# Patient Record
Sex: Female | Born: 2000 | Race: White | Hispanic: No | Marital: Single | Smoking: Never smoker
Health system: Southern US, Community
[De-identification: ages and names within clinical notes are randomized; demographics above are authoritative.]

## PROBLEM LIST (undated history)

## (undated) DIAGNOSIS — E079 Disorder of thyroid, unspecified: Secondary | ICD-10-CM

## (undated) DIAGNOSIS — Z23 Encounter for immunization: Secondary | ICD-10-CM

## (undated) HISTORY — DX: Encounter for immunization: Z23

## (undated) HISTORY — PX: OTHER SURGICAL HISTORY: SHX169

## (undated) HISTORY — PX: NO PAST SURGERIES: SHX2092

---
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
## 2021-03-02 ENCOUNTER — Other Ambulatory Visit: Payer: Self-pay

## 2021-03-02 DIAGNOSIS — Z3045 Encounter for surveillance of transdermal patch hormonal contraceptive device: Secondary | ICD-10-CM

## 2021-03-02 MED ORDER — XULANE 150-35 MCG/24HR TD PTWK: MEDICATED_PATCH | TRANSDERMAL | 0 refills | Status: DC

## 2021-03-02 NOTE — Telephone Encounter (Signed)
Pt calling; has scheduled appt for 8/23rd; will need refill of bc patches to get to appt.  [phone]  Left detailed msg rx has been sent in.

## 2021-04-14 ENCOUNTER — Ambulatory Visit: Payer: BC Managed Care – PPO | Admitting: Obstetrics and Gynecology

## 2021-04-16 ENCOUNTER — Other Ambulatory Visit: Payer: Self-pay | Admitting: Obstetrics and Gynecology

## 2021-04-16 DIAGNOSIS — Z3045 Encounter for surveillance of transdermal patch hormonal contraceptive device: Secondary | ICD-10-CM

## 2021-04-17 NOTE — Telephone Encounter (Signed)
Patient is scheduled for 05/01/21 with [facility] for annual

## 2021-04-30 ENCOUNTER — Other Ambulatory Visit (HOSPITAL_COMMUNITY)
Admission: RE | Admit: 2021-04-30 | Discharge: 2021-04-30 | Disposition: A | Discharge status: Home / Self Care | Payer: BC Managed Care – PPO | Source: Ambulatory Visit | Attending: Obstetrics and Gynecology | Admitting: Obstetrics and Gynecology

## 2021-04-30 ENCOUNTER — Ambulatory Visit (INDEPENDENT_AMBULATORY_CARE_PROVIDER_SITE_OTHER): Payer: BC Managed Care – PPO | Admitting: Obstetrics and Gynecology

## 2021-04-30 ENCOUNTER — Encounter: Payer: Self-pay | Admitting: Obstetrics and Gynecology

## 2021-04-30 ENCOUNTER — Other Ambulatory Visit: Payer: Self-pay

## 2021-04-30 VITALS — BP 100/80 | Ht 64.0 in | Wt 131.0 lb

## 2021-04-30 DIAGNOSIS — Z01419 Encounter for gynecological examination (general) (routine) without abnormal findings: Secondary | ICD-10-CM

## 2021-04-30 DIAGNOSIS — Z113 Encounter for screening for infections with a predominantly sexual mode of transmission: Secondary | ICD-10-CM | POA: Insufficient documentation

## 2021-04-30 DIAGNOSIS — Z3045 Encounter for surveillance of transdermal patch hormonal contraceptive device: Secondary | ICD-10-CM

## 2021-04-30 MED ORDER — ZAFEMY 150-35 MCG/24HR TD PTWK: MEDICATED_PATCH | TRANSDERMAL | 3 refills | Status: DC

## 2021-04-30 NOTE — Patient Instructions (Signed)
I value your feedback and you entrusting us with your care. If you get a  patient survey, I would appreciate you taking the time to let us know about your experience today. Thank you! ? ? ?

## 2021-04-30 NOTE — Progress Notes (Signed)
PCP:  [name], MD   Chief Complaint  Patient presents with   Gynecologic Exam    No concerns     HPI:      [name] is a 20 y.o. G0P0000 who LMP was Patient's last menstrual period was 04/29/2021 (exact date)., presents today for her annual examination.  Her menses are regular every 28-30 days, lasting 5 days.  Dysmenorrhea mild, occurring first 1-2 days of flow. She does not have intermenstrual bleeding.  Sex activity: single partner, contraception - xulane (started 12/20), happy with it. Side effects with previous BC.  No hx of HTN, DVTs, migraines.   Last Pap: N/A Hx of STDs: none  There is a FH of breast cancer in her MGM. MGM is BRCA neg, pt's mom is MyRisk neg. There is no FH of ovarian cancer. The patient does not do self-breast exams.  Tobacco use: The patient denies current or previous tobacco use. Alcohol use: none No drug use.  Exercise: mod active  She does get adequate calcium and Vitamin D in her diet. Gardasil completed.  Past Medical History:  Diagnosis Date   Thyroid disease    Vaccine for human papilloma virus (HPV) types 6, 11, 16, and 18 administered     Past Surgical History:  Procedure Laterality Date   NO PAST SURGERIES      Family History  Problem Relation Age of Onset   Other Mother        cervical dysplasia   Lung cancer Maternal Aunt    Breast cancer Maternal Grandmother 42       BRCA neg   Skin cancer Maternal Grandmother    Lung cancer Maternal Great-grandmother    Ovarian cancer Neg Hx     Social History   Socioeconomic History   Marital status: Single    Spouse name: Not on file   Number of children: Not on file   Years of education: Not on file   Highest education level: Not on file  Occupational History   Not on file  Tobacco Use   Smoking status: Never   Smokeless tobacco: Never  Vaping Use   Vaping Use: Never used  Substance and Sexual Activity   Alcohol use: No   Drug use: No    Sexual activity: Yes    Birth control/protection: Patch  Other Topics Concern   Not on file  Social History Narrative   Not on file   Social Determinants of Health   Financial Resource Strain: Not on file  Food Insecurity: Not on file  Transportation Needs: Not on file  Physical Activity: Not on file  Stress: Not on file  Social Connections: Not on file  Intimate Partner Violence: Not on file    Outpatient Medications Prior to Visit  Medication Sig Dispense Refill   esomeprazole (NEXIUM) 20 MG capsule Take by mouth.     levothyroxine (SYNTHROID) 100 MCG tablet Take 100 mcg by mouth daily.     ZAFEMY 150-35 MCG/24HR transdermal patch APPLY 1 PATCH TOPICALLY ONCE A WEEK FOR 3 WEEKS, THEN 1 WEEK WITHOUT PATCH. 6 patch 0   ergocalciferol (VITAMIN D2) 1.25 MG (50000 UT) capsule Take by mouth.     escitalopram (LEXAPRO) 20 MG tablet Take 20 mg by mouth every morning.     ondansetron (ZOFRAN-ODT) 4 MG disintegrating tablet      propranolol (INDERAL) 20 MG tablet Take by mouth.     No facility-administered medications prior to visit.  ROS:  Review of Systems  Constitutional:  Negative for fatigue, fever and unexpected weight change.  Respiratory:  Negative for cough, shortness of breath and wheezing.   Cardiovascular:  Negative for chest pain, palpitations and leg swelling.  Gastrointestinal:  Negative for blood in stool, constipation, diarrhea, nausea and vomiting.  Endocrine: Negative for cold intolerance, heat intolerance and polyuria.  Genitourinary:  Positive for dyspareunia. Negative for dysuria, flank pain, frequency, genital sores, hematuria, menstrual problem, pelvic pain, urgency, vaginal bleeding, vaginal discharge and vaginal pain.  Musculoskeletal:  Negative for arthralgias, back pain, joint swelling and myalgias.  Skin:  Negative for rash.  Neurological:  Negative for dizziness, syncope, light-headedness, numbness and headaches.  Hematological:  Negative for  adenopathy.  Psychiatric/Behavioral:  Negative for agitation, confusion, dysphoric mood, sleep disturbance and suicidal ideas. The patient is not nervous/anxious.  BREAST: No symptoms   Objective: BP 100/80   Ht 5' 4"  (1.626 m)   Wt 131 lb (59.4 kg)   LMP 04/29/2021 (Exact Date)   BMI 22.49 kg/m    Physical Exam Constitutional:      Appearance: She is well-developed.  Genitourinary:     Vulva normal.     Right Labia: No rash, tenderness or lesions.    Left Labia: No tenderness, lesions or rash.    No vaginal discharge, erythema or tenderness.      Right Adnexa: not tender and no mass present.    Left Adnexa: not tender and no mass present.    No cervical friability or polyp.     Uterus is not enlarged or tender.  Breasts:    Right: No mass, nipple discharge, skin change or tenderness.     Left: No mass, nipple discharge, skin change or tenderness.  Neck:     Thyroid: No thyromegaly.  Cardiovascular:     Rate and Rhythm: Normal rate and regular rhythm.     Heart sounds: Normal heart sounds. No murmur heard. Pulmonary:     Effort: Pulmonary effort is normal.     Breath sounds: Normal breath sounds.  Abdominal:     Palpations: Abdomen is soft.     Tenderness: There is no abdominal tenderness. There is no guarding or rebound.  Musculoskeletal:        General: Normal range of motion.     Cervical back: Normal range of motion.  Lymphadenopathy:     Cervical: No cervical adenopathy.  Neurological:     General: No focal deficit present.     Mental Status: She is alert and oriented to person, place, and time.     Cranial Nerves: No cranial nerve deficit.  Skin:    General: Skin is warm and dry.  Psychiatric:        Mood and Affect: Mood normal.        Behavior: Behavior normal.        Thought Content: Thought content normal.        Judgment: Judgment normal.  Vitals reviewed.    Assessment/Plan: Encounter for annual routine gynecological examination  Screening for  STD (sexually transmitted disease) - Plan: Cervicovaginal ancillary only  Encounter for surveillance of transdermal patch hormonal contraceptive device - Plan: norelgestromin-ethinyl estradiol (ZAFEMY) 150-35 MCG/24HR transdermal patch; Rx RF   Meds ordered this encounter  Medications   norelgestromin-ethinyl estradiol (ZAFEMY) 150-35 MCG/24HR transdermal patch    Sig: APPLY 1 PATCH TOPICALLY ONCE A WEEK FOR 3 WEEKS, THEN 1 WEEK WITHOUT PATCH.    Dispense:  9 patch  Refill:  3    Order Specific Question:   Supervising Provider    Answer:   Gae Dry [[id number]]              GYN counsel adequate intake of calcium and vitamin D, diet and exercise     F/U  Return in about 1 year (around 04/30/2022).   Keigan Tafoya B. Ostin Mathey, PA-C 04/30/2021 2:32 PM

## 2021-05-04 LAB — CERVICOVAGINAL ANCILLARY ONLY
Chlamydia: NEGATIVE
Comment: NEGATIVE
Comment: NORMAL
Neisseria Gonorrhea: NEGATIVE

## 2022-03-22 ENCOUNTER — Encounter: Payer: Self-pay | Admitting: Obstetrics and Gynecology

## 2022-03-22 NOTE — Telephone Encounter (Signed)
Can you call and schedule with [facility]. Pain with intercourse

## 2022-04-14 NOTE — Progress Notes (Unsigned)
    [name], MD   No chief complaint on file.   HPI:      [name] is a 21 y.o. G0P0000 whose LMP was No LMP recorded., presents today for ***  Neg STD testing 9/22  There are no problems to display for this patient.   Past Surgical History:  Procedure Laterality Date   NO PAST SURGERIES      Family History  Problem Relation Age of Onset   Other Mother        cervical dysplasia   Lung cancer Maternal Aunt    Breast cancer Maternal Grandmother 15       BRCA neg   Skin cancer Maternal Grandmother    Lung cancer Maternal Great-grandmother    Ovarian cancer Neg Hx     Social History   Socioeconomic History   Marital status: Single    Spouse name: Not on file   Number of children: Not on file   Years of education: Not on file   Highest education level: Not on file  Occupational History   Not on file  Tobacco Use   Smoking status: Never   Smokeless tobacco: Never  Vaping Use   Vaping Use: Never used  Substance and Sexual Activity   Alcohol use: No   Drug use: No   Sexual activity: Yes    Birth control/protection: Patch  Other Topics Concern   Not on file  Social History Narrative   Not on file   Social Determinants of Health   Financial Resource Strain: Not on file  Food Insecurity: Not on file  Transportation Needs: Not on file  Physical Activity: Not on file  Stress: Not on file  Social Connections: Not on file  Intimate Partner Violence: Not on file    Outpatient Medications Prior to Visit  Medication Sig Dispense Refill   esomeprazole (NEXIUM) 20 MG capsule Take by mouth.     levothyroxine (SYNTHROID) 100 MCG tablet Take 100 mcg by mouth daily.     norelgestromin-ethinyl estradiol (ZAFEMY) 150-35 MCG/24HR transdermal patch APPLY 1 PATCH TOPICALLY ONCE A WEEK FOR 3 WEEKS, THEN 1 WEEK WITHOUT PATCH. 9 patch 3   No facility-administered medications prior to visit.      ROS:  Review of Systems BREAST: No  symptoms   OBJECTIVE:   Vitals:  There were no vitals taken for this visit.  Physical Exam  Results: No results found for this or any previous visit (from the past 24 hour(s)).   Assessment/Plan: No diagnosis found.    No orders of the defined types were placed in this encounter.     No follow-ups on file.  Maralyn Witherell B. Amsi Grimley, PA-C 04/14/2022 3:22 PM

## 2022-04-15 ENCOUNTER — Other Ambulatory Visit (HOSPITAL_COMMUNITY)
Admission: RE | Admit: 2022-04-15 | Discharge: 2022-04-15 | Disposition: A | Discharge status: Home / Self Care | Payer: Commercial Managed Care - PPO | Source: Ambulatory Visit | Attending: Obstetrics and Gynecology | Admitting: Obstetrics and Gynecology

## 2022-04-15 ENCOUNTER — Encounter: Payer: Self-pay | Admitting: Obstetrics and Gynecology

## 2022-04-15 ENCOUNTER — Ambulatory Visit (INDEPENDENT_AMBULATORY_CARE_PROVIDER_SITE_OTHER): Payer: Commercial Managed Care - PPO | Admitting: Obstetrics and Gynecology

## 2022-04-15 VITALS — BP 90/60 | Ht 64.0 in | Wt 123.0 lb

## 2022-04-15 DIAGNOSIS — L309 Dermatitis, unspecified: Secondary | ICD-10-CM | POA: Diagnosis not present

## 2022-04-15 DIAGNOSIS — N941 Unspecified dyspareunia: Secondary | ICD-10-CM

## 2022-04-15 DIAGNOSIS — N930 Postcoital and contact bleeding: Secondary | ICD-10-CM

## 2022-04-15 DIAGNOSIS — Z113 Encounter for screening for infections with a predominantly sexual mode of transmission: Secondary | ICD-10-CM | POA: Insufficient documentation

## 2022-04-15 MED ORDER — FLUCONAZOLE 150 MG PO TABS: 150.0000 mg | ORAL_TABLET | Freq: Once | ORAL | 0 refills | Status: AC

## 2022-04-15 MED ORDER — CLOTRIMAZOLE-BETAMETHASONE 1-0.05 % EX CREA: TOPICAL_CREAM | CUTANEOUS | 0 refills | Status: DC

## 2022-04-15 NOTE — Patient Instructions (Signed)
I value your feedback and you entrusting us with your care. If you get a [facility] patient survey, I would appreciate you taking the time to let us know about your experience today. Thank you! ? ? ?

## 2022-04-16 LAB — CERVICOVAGINAL ANCILLARY ONLY
Chlamydia: NEGATIVE
Comment: NEGATIVE
Comment: NORMAL
Neisseria Gonorrhea: NEGATIVE

## 2022-05-08 ENCOUNTER — Other Ambulatory Visit: Payer: Self-pay | Admitting: Obstetrics and Gynecology

## 2022-05-08 DIAGNOSIS — Z3045 Encounter for surveillance of transdermal patch hormonal contraceptive device: Secondary | ICD-10-CM

## 2022-05-16 ENCOUNTER — Other Ambulatory Visit: Payer: Self-pay | Admitting: Obstetrics and Gynecology

## 2022-05-16 DIAGNOSIS — Z3045 Encounter for surveillance of transdermal patch hormonal contraceptive device: Secondary | ICD-10-CM

## 2022-05-17 ENCOUNTER — Encounter: Payer: Self-pay | Admitting: Obstetrics and Gynecology

## 2023-04-14 ENCOUNTER — Other Ambulatory Visit: Payer: Self-pay | Admitting: Obstetrics and Gynecology

## 2023-04-14 DIAGNOSIS — Z3045 Encounter for surveillance of transdermal patch hormonal contraceptive device: Secondary | ICD-10-CM

## 2023-04-14 NOTE — Telephone Encounter (Signed)
Pt calling; has scheduled annual for 10/7th; needs refill of bc.  [phone]  Pt aware refill eRx'd.

## 2023-05-26 NOTE — Progress Notes (Signed)
PCP:  Enrique Sack, MD   Chief Complaint  Patient presents with   Gynecologic Exam    No concerns     HPI:      [name] is a 22 y.o. G0P0000 who LMP was Patient's last menstrual period was 05/22/2023 (exact date)., presents today for her annual examination.  Her menses are regular every 28-30 days, lasting 4-5 days, mod flow.  Dysmenorrhea mild, occurring first 1-2 days of flow. She does not have intermenstrual bleeding.  Sex activity: single partner, contraception - xulane (started 12/20), happy with it. Side effects with previous BC.  No hx of HTN, DVTs, migraines.   Last Pap: N/A Hx of STDs: none  There is a FH of breast cancer in her MGM. MGM is BRCA neg, pt's mom is MyRisk neg. There is no FH of ovarian cancer. The patient does not do self-breast exams.  Tobacco use: The patient denies current or previous tobacco use. Alcohol use: none No drug use.  Exercise: mod active  She does get adequate calcium and Vitamin D in her diet. Gardasil completed.  Past Medical History:  Diagnosis Date   Thyroid disease    Vaccine for human papilloma virus (HPV) types 6, 11, 16, and 18 administered     Past Surgical History:  Procedure Laterality Date   NO PAST SURGERIES      Family History  Problem Relation Age of Onset   Other Mother        cervical dysplasia   Lung cancer Maternal Aunt    Breast cancer Maternal Grandmother 48       BRCA neg   Skin cancer Maternal Grandmother    Lung cancer Maternal Great-grandmother    Ovarian cancer Neg Hx     Social History   Socioeconomic History   Marital status: Single    Spouse name: Not on file   Number of children: Not on file   Years of education: Not on file   Highest education level: Not on file  Occupational History   Not on file  Tobacco Use   Smoking status: Never   Smokeless tobacco: Never  Vaping Use   Vaping status: Never Used  Substance and Sexual Activity   Alcohol use: No   Drug  use: No   Sexual activity: Yes    Birth control/protection: Patch  Other Topics Concern   Not on file  Social History Narrative   Not on file   Social Determinants of Health   Financial Resource Strain: Medium Risk (03/05/2020)   [facility]   Overall Financial Resource Strain (CARDIA)    Difficulty of Paying Living Expenses: Somewhat hard  Food Insecurity: No Food Insecurity (03/25/2023)   Received from [facility]   Hunger Vital Sign    Worried About Running Out of Food in the Last Year: Never true    Ran Out of Food in the Last Year: Never true  Transportation Needs: No Transportation Needs (03/05/2020)   [facility], [facility]   PRAPARE - Transportation    Lack of Transportation (Medical): No    Lack of Transportation (Non-Medical): No  Physical Activity: Not on file  Stress: Not on file  Social Connections: Not on file  Intimate Partner Violence: Not on file    Outpatient Medications Prior to Visit  Medication Sig Dispense Refill   levothyroxine (SYNTHROID) 100 MCG tablet Take 100 mcg by mouth daily.  norelgestromin-ethinyl estradiol (ZAFEMY) 150-35 MCG/24HR transdermal patch APPLY 1 PATCH ONTO THE SKIN ONCE A WEEK FOR 3 WEEKS THEN 1 WEEK WITHOUT PATCH 9 patch 0   clotrimazole-betamethasone (LOTRISONE) cream Apply externally BID prn sx up to 2 wks 15 g 0   esomeprazole (NEXIUM) 20 MG capsule Take by mouth.     No facility-administered medications prior to visit.      ROS:  Review of Systems  Constitutional:  Negative for fatigue, fever and unexpected weight change.  Respiratory:  Negative for cough, shortness of breath and wheezing.   Cardiovascular:  Negative for chest pain, palpitations and leg swelling.  Gastrointestinal:  Negative for blood in stool, constipation, diarrhea, nausea and vomiting.  Endocrine: Negative for cold intolerance, heat intolerance and polyuria.  Genitourinary:  Negative for  dyspareunia, dysuria, flank pain, frequency, genital sores, hematuria, menstrual problem, pelvic pain, urgency, vaginal bleeding, vaginal discharge and vaginal pain.  Musculoskeletal:  Negative for arthralgias, back pain, joint swelling and myalgias.  Skin:  Negative for rash.  Neurological:  Negative for dizziness, syncope, light-headedness, numbness and headaches.  Hematological:  Negative for adenopathy.  Psychiatric/Behavioral:  Negative for agitation, confusion, dysphoric mood, sleep disturbance and suicidal ideas. The patient is not nervous/anxious.   BREAST: No symptoms   Objective: BP 104/70   Pulse 85   Ht 5\' 4"  (1.626 m)   Wt 133 lb (60.3 kg)   LMP 05/22/2023 (Exact Date)   BMI 22.83 kg/m    Physical Exam Constitutional:      Appearance: She is well-developed.  Genitourinary:     Vulva normal.     Right Labia: No rash, tenderness or lesions.    Left Labia: No tenderness, lesions or rash.    No vaginal discharge, erythema or tenderness.      Right Adnexa: not tender and no mass present.    Left Adnexa: not tender and no mass present.    No cervical friability or polyp.     Uterus is not enlarged or tender.  Breasts:    Right: No mass, nipple discharge, skin change or tenderness.     Left: No mass, nipple discharge, skin change or tenderness.  Neck:     Thyroid: No thyromegaly.  Cardiovascular:     Rate and Rhythm: Normal rate and regular rhythm.     Heart sounds: Normal heart sounds. No murmur heard. Pulmonary:     Effort: Pulmonary effort is normal.     Breath sounds: Normal breath sounds.  Abdominal:     Palpations: Abdomen is soft.     Tenderness: There is no abdominal tenderness. There is no guarding or rebound.  Musculoskeletal:        General: Normal range of motion.     Cervical back: Normal range of motion.  Lymphadenopathy:     Cervical: No cervical adenopathy.  Neurological:     General: No focal deficit present.     Mental Status: She is alert  and oriented to person, place, and time.     Cranial Nerves: No cranial nerve deficit.  Skin:    General: Skin is warm and dry.  Psychiatric:        Mood and Affect: Mood normal.        Behavior: Behavior normal.        Thought Content: Thought content normal.        Judgment: Judgment normal.  Vitals reviewed.     Assessment/Plan: Encounter for annual routine gynecological examination  Cervical cancer screening -  Plan: Cytology - PAP  Screening for STD (sexually transmitted disease) - Plan: Cytology - PAP  Encounter for surveillance of transdermal patch hormonal contraceptive device - Plan: norelgestromin-ethinyl estradiol (ZAFEMY) 150-35 MCG/24HR transdermal patch; Rx Rx eRxd   Meds ordered this encounter  Medications   norelgestromin-ethinyl estradiol (ZAFEMY) 150-35 MCG/24HR transdermal patch    Sig: APPLY 1 PATCH ONTO THE SKIN ONCE A WEEK FOR 3 WEEKS THEN 1 WEEK WITHOUT PATCH    Dispense:  9 patch    Refill:  3    Order Specific Question:   Supervising Provider    Answer:   Waymon Budge              GYN counsel adequate intake of calcium and vitamin D, diet and exercise     F/U  Return in about 1 year (around 05/29/2024).   Quenesha Douglass B. Lenny Fiumara, PA-C 05/30/2023 9:26 AM

## 2023-05-30 ENCOUNTER — Encounter: Payer: Self-pay | Admitting: Obstetrics and Gynecology

## 2023-05-30 ENCOUNTER — Other Ambulatory Visit (HOSPITAL_COMMUNITY)
Admission: RE | Admit: 2023-05-30 | Discharge: 2023-05-30 | Disposition: A | Discharge status: Home / Self Care | Payer: Commercial Managed Care - PPO | Source: Ambulatory Visit | Attending: Obstetrics and Gynecology | Admitting: Obstetrics and Gynecology

## 2023-05-30 ENCOUNTER — Ambulatory Visit: Payer: Commercial Managed Care - PPO | Admitting: Obstetrics and Gynecology

## 2023-05-30 VITALS — BP 104/70 | HR 85 | Ht 64.0 in | Wt 133.0 lb

## 2023-05-30 DIAGNOSIS — Z113 Encounter for screening for infections with a predominantly sexual mode of transmission: Secondary | ICD-10-CM | POA: Diagnosis present

## 2023-05-30 DIAGNOSIS — Z3045 Encounter for surveillance of transdermal patch hormonal contraceptive device: Secondary | ICD-10-CM

## 2023-05-30 DIAGNOSIS — Z124 Encounter for screening for malignant neoplasm of cervix: Secondary | ICD-10-CM

## 2023-05-30 DIAGNOSIS — Z01419 Encounter for gynecological examination (general) (routine) without abnormal findings: Secondary | ICD-10-CM | POA: Diagnosis not present

## 2023-05-30 MED ORDER — NORELGESTROMIN-ETH ESTRADIOL 150-35 MCG/24HR TD PTWK: MEDICATED_PATCH | TRANSDERMAL | 3 refills | Status: DC

## 2023-05-30 NOTE — Patient Instructions (Signed)
I value your feedback and you entrusting us with your care. If you get a [facility] patient survey, I would appreciate you taking the time to let us know about your experience today. Thank you! ? ? ?

## 2023-06-02 LAB — CYTOLOGY - PAP
Chlamydia: NEGATIVE
Comment: NEGATIVE
Comment: NORMAL
Diagnosis: NEGATIVE
Diagnosis: REACTIVE
Neisseria Gonorrhea: NEGATIVE

## 2024-01-09 ENCOUNTER — Encounter: Payer: Self-pay | Admitting: Obstetrics and Gynecology

## 2024-07-22 NOTE — Progress Notes (Unsigned)
 PCP:  Claudene Aleck Pitt, MD   No chief complaint on file.    HPI:      [name] is a 23 y.o. G0P0000 who LMP was No LMP recorded., presents today for her annual examination.  Her menses are regular every 28-30 days, lasting 4-5 days, mod flow.  Dysmenorrhea mild, occurring first 1-2 days of flow. She does not have intermenstrual bleeding.  Sex activity: single partner, contraception - xulane  (started 12/20), happy with it. Side effects with previous BC.  No hx of HTN, DVTs, migraines.   Last Pap: 05/30/23 Results were NILM Hx of STDs: none  There is a FH of breast cancer in her MGM. MGM is BRCA neg, pt's mom is MyRisk neg. There is no FH of ovarian cancer. The patient does not do self-breast exams.  Tobacco use: The patient denies current or previous tobacco use. Alcohol use: none No drug use.  Exercise: mod active  She does get adequate calcium and Vitamin D in her diet. Gardasil completed.  Past Medical History:  Diagnosis Date   Thyroid disease    Vaccine for human papilloma virus (HPV) types 6, 11, 16, and 18 administered     Past Surgical History:  Procedure Laterality Date   NO PAST SURGERIES      Family History  Problem Relation Age of Onset   Other Mother        cervical dysplasia   Lung cancer Maternal Aunt    Breast cancer Maternal Grandmother 45       BRCA neg   Skin cancer Maternal Grandmother    Lung cancer Maternal Great-grandmother    Ovarian cancer Neg Hx     Social History   Socioeconomic History   Marital status: Single    Spouse name: Not on file   Number of children: Not on file   Years of education: Not on file   Highest education level: Not on file  Occupational History   Not on file  Tobacco Use   Smoking status: Never   Smokeless tobacco: Never  Vaping Use   Vaping status: Never Used  Substance and Sexual Activity   Alcohol use: No   Drug use: No   Sexual activity: Yes    Birth control/protection: Patch   Other Topics Concern   Not on file  Social History Narrative   Not on file   Social Drivers of Health   Financial Resource Strain: Patient Declined (01/12/2024)   Received [facility]   Overall Financial Resource Strain (CARDIA)    Difficulty of Paying Living Expenses: Patient declined  Food Insecurity: No Food Insecurity (01/12/2024)   Received from [facility]   Hunger Vital Sign    Within the past 12 months, you worried that your food would run out before you got the money to buy more.: Never true    Within the past 12 months, the food you bought just didn't last and you didn't have money to get more.: Never true  Transportation Needs: No Transportation Needs (01/12/2024)   Received from [facility]   PRAPARE - Transportation    Lack of Transportation (Medical): No    Lack of Transportation (Non-Medical): No  Physical Activity: Not on file  Stress: Not on file  Social Connections: Not on file  Intimate Partner Violence: Not on file    Outpatient Medications Prior to Visit  Medication Sig Dispense Refill   levothyroxine (SYNTHROID) 100 MCG tablet Take 100 mcg  by mouth daily.     norelgestromin -ethinyl estradiol  (ZAFEMY ) 150-35 MCG/24HR transdermal patch APPLY 1 PATCH ONTO THE SKIN ONCE A WEEK FOR 3 WEEKS THEN 1 WEEK WITHOUT PATCH 9 patch 3   No facility-administered medications prior to visit.      ROS:  Review of Systems  Constitutional:  Negative for fatigue, fever and unexpected weight change.  Respiratory:  Negative for cough, shortness of breath and wheezing.   Cardiovascular:  Negative for chest pain, palpitations and leg swelling.  Gastrointestinal:  Negative for blood in stool, constipation, diarrhea, nausea and vomiting.  Endocrine: Negative for cold intolerance, heat intolerance and polyuria.  Genitourinary:  Negative for dyspareunia, dysuria, flank pain, frequency, genital sores, hematuria, menstrual problem, pelvic pain, urgency, vaginal  bleeding, vaginal discharge and vaginal pain.  Musculoskeletal:  Negative for arthralgias, back pain, joint swelling and myalgias.  Skin:  Negative for rash.  Neurological:  Negative for dizziness, syncope, light-headedness, numbness and headaches.  Hematological:  Negative for adenopathy.  Psychiatric/Behavioral:  Negative for agitation, confusion, dysphoric mood, sleep disturbance and suicidal ideas. The patient is not nervous/anxious.   BREAST: No symptoms   Objective: There were no vitals taken for this visit.   Physical Exam Constitutional:      Appearance: She is well-developed.  Genitourinary:     Vulva normal.     Right Labia: No rash, tenderness or lesions.    Left Labia: No tenderness, lesions or rash.    No vaginal discharge, erythema or tenderness.      Right Adnexa: not tender and no mass present.    Left Adnexa: not tender and no mass present.    No cervical friability or polyp.     Uterus is not enlarged or tender.  Breasts:    Right: No mass, nipple discharge, skin change or tenderness.     Left: No mass, nipple discharge, skin change or tenderness.  Neck:     Thyroid: No thyromegaly.  Cardiovascular:     Rate and Rhythm: Normal rate and regular rhythm.     Heart sounds: Normal heart sounds. No murmur heard. Pulmonary:     Effort: Pulmonary effort is normal.     Breath sounds: Normal breath sounds.  Abdominal:     Palpations: Abdomen is soft.     Tenderness: There is no abdominal tenderness. There is no guarding or rebound.  Musculoskeletal:        General: Normal range of motion.     Cervical back: Normal range of motion.  Lymphadenopathy:     Cervical: No cervical adenopathy.  Neurological:     General: No focal deficit present.     Mental Status: She is alert and oriented to person, place, and time.     Cranial Nerves: No cranial nerve deficit.  Skin:    General: Skin is warm and dry.  Psychiatric:        Mood and Affect: Mood normal.         Behavior: Behavior normal.        Thought Content: Thought content normal.        Judgment: Judgment normal.  Vitals reviewed.     Assessment/Plan: Encounter for annual routine gynecological examination  Cervical cancer screening - Plan: Cytology - PAP  Screening for STD (sexually transmitted disease) - Plan: Cytology - PAP  Encounter for surveillance of transdermal patch hormonal contraceptive device - Plan: norelgestromin -ethinyl estradiol  (ZAFEMY ) 150-35 MCG/24HR transdermal patch; Rx Rx eRxd   No orders of the defined types  were placed in this encounter.             GYN counsel adequate intake of calcium and vitamin D, diet and exercise     F/U  No follow-ups on file.   Zhion Pevehouse B. Isaah Furry, PA-C 07/22/2024 6:09 PM

## 2024-07-23 ENCOUNTER — Other Ambulatory Visit (HOSPITAL_COMMUNITY)
Admission: RE | Admit: 2024-07-23 | Discharge: 2024-07-23 | Disposition: A | Discharge status: Home / Self Care | Source: Ambulatory Visit | Attending: Obstetrics and Gynecology | Admitting: Obstetrics and Gynecology

## 2024-07-23 ENCOUNTER — Ambulatory Visit: Admitting: Obstetrics and Gynecology

## 2024-07-23 ENCOUNTER — Encounter: Payer: Self-pay | Admitting: Obstetrics and Gynecology

## 2024-07-23 VITALS — BP 105/72 | HR 91 | Ht 64.0 in | Wt 143.0 lb

## 2024-07-23 DIAGNOSIS — Z113 Encounter for screening for infections with a predominantly sexual mode of transmission: Secondary | ICD-10-CM | POA: Diagnosis present

## 2024-07-23 DIAGNOSIS — Z3045 Encounter for surveillance of transdermal patch hormonal contraceptive device: Secondary | ICD-10-CM

## 2024-07-23 DIAGNOSIS — Z01419 Encounter for gynecological examination (general) (routine) without abnormal findings: Secondary | ICD-10-CM

## 2024-07-23 MED ORDER — NORELGESTROMIN-ETH ESTRADIOL 150-35 MCG/24HR TD PTWK: MEDICATED_PATCH | TRANSDERMAL | 3 refills | Status: AC

## 2024-07-23 NOTE — Patient Instructions (Signed)
 I value your feedback and you entrusting [place] with your care. If you get a [facility] patient survey, I would appreciate you taking the time to let us know about your experience today. Thank you! ? ? ?

## 2024-07-25 LAB — CERVICOVAGINAL ANCILLARY ONLY
Chlamydia: NEGATIVE
Comment: NEGATIVE
Comment: NORMAL
Neisseria Gonorrhea: NEGATIVE
